# Patient Record
Sex: Female | Born: 1965 | Race: Black or African American | Hispanic: No | Marital: Married | Smoking: Never smoker
Health system: Southern US, Community
[De-identification: ages and names within clinical notes are randomized; demographics above are authoritative.]

## PROBLEM LIST (undated history)

## (undated) DIAGNOSIS — D219 Benign neoplasm of connective and other soft tissue, unspecified: Secondary | ICD-10-CM

## (undated) DIAGNOSIS — I10 Essential (primary) hypertension: Secondary | ICD-10-CM

---

## 1998-08-12 HISTORY — PX: TUBAL LIGATION: SHX77

## 2000-08-12 HISTORY — PX: CHOLECYSTECTOMY: SHX55

## 2007-02-04 ENCOUNTER — Encounter: Admission: RE | Admit: 2007-02-04 | Discharge: 2007-02-04 | Payer: Self-pay | Admitting: Family Medicine

## 2009-04-26 ENCOUNTER — Ambulatory Visit (HOSPITAL_COMMUNITY): Admission: RE | Admit: 2009-04-26 | Discharge: 2009-04-26 | Payer: Self-pay | Admitting: Family Medicine

## 2010-04-27 ENCOUNTER — Ambulatory Visit (HOSPITAL_COMMUNITY): Admission: RE | Admit: 2010-04-27 | Discharge: 2010-04-27 | Payer: Self-pay | Admitting: Internal Medicine

## 2010-05-01 ENCOUNTER — Encounter: Admission: RE | Admit: 2010-05-01 | Discharge: 2010-05-01 | Payer: Self-pay | Admitting: Family Medicine

## 2011-04-02 ENCOUNTER — Other Ambulatory Visit (HOSPITAL_COMMUNITY): Payer: Self-pay | Admitting: Internal Medicine

## 2011-04-02 DIAGNOSIS — Z139 Encounter for screening, unspecified: Secondary | ICD-10-CM

## 2011-04-09 ENCOUNTER — Other Ambulatory Visit: Payer: Self-pay | Admitting: Family Medicine

## 2011-04-09 DIAGNOSIS — Z1231 Encounter for screening mammogram for malignant neoplasm of breast: Secondary | ICD-10-CM

## 2011-05-03 ENCOUNTER — Ambulatory Visit (HOSPITAL_COMMUNITY): Payer: Self-pay

## 2011-05-07 ENCOUNTER — Ambulatory Visit
Admission: RE | Admit: 2011-05-07 | Discharge: 2011-05-07 | Disposition: A | Discharge status: Home / Self Care | Payer: 59 | Source: Ambulatory Visit | Attending: Family Medicine | Admitting: Family Medicine

## 2011-05-07 DIAGNOSIS — Z1231 Encounter for screening mammogram for malignant neoplasm of breast: Secondary | ICD-10-CM

## 2012-05-21 ENCOUNTER — Other Ambulatory Visit (HOSPITAL_COMMUNITY): Payer: Self-pay | Admitting: Family Medicine

## 2012-05-21 DIAGNOSIS — D4860 Neoplasm of uncertain behavior of unspecified breast: Secondary | ICD-10-CM

## 2012-05-27 ENCOUNTER — Ambulatory Visit (HOSPITAL_COMMUNITY)
Admission: RE | Admit: 2012-05-27 | Discharge: 2012-05-27 | Disposition: A | Discharge status: Home / Self Care | Payer: 59 | Source: Ambulatory Visit | Attending: Family Medicine | Admitting: Family Medicine

## 2012-05-27 DIAGNOSIS — N6009 Solitary cyst of unspecified breast: Secondary | ICD-10-CM | POA: Insufficient documentation

## 2012-05-27 DIAGNOSIS — D4860 Neoplasm of uncertain behavior of unspecified breast: Secondary | ICD-10-CM

## 2012-07-24 ENCOUNTER — Other Ambulatory Visit: Payer: Self-pay | Admitting: Obstetrics and Gynecology

## 2012-07-24 DIAGNOSIS — D259 Leiomyoma of uterus, unspecified: Secondary | ICD-10-CM

## 2012-08-20 ENCOUNTER — Other Ambulatory Visit: Payer: Self-pay | Admitting: Emergency Medicine

## 2012-08-20 ENCOUNTER — Ambulatory Visit
Admission: RE | Admit: 2012-08-20 | Discharge: 2012-08-20 | Disposition: A | Discharge status: Home / Self Care | Payer: 59 | Source: Ambulatory Visit | Attending: Obstetrics and Gynecology | Admitting: Obstetrics and Gynecology

## 2012-08-20 DIAGNOSIS — D259 Leiomyoma of uterus, unspecified: Secondary | ICD-10-CM

## 2012-08-20 DIAGNOSIS — I10 Essential (primary) hypertension: Secondary | ICD-10-CM

## 2012-08-20 LAB — BUN: BUN: 12 mg/dL (ref 6–23)

## 2012-08-20 LAB — CREATININE WITH EST GFR: Creat: 0.64 mg/dL (ref 0.50–1.10)

## 2012-08-20 NOTE — Progress Notes (Signed)
LMP:  07/26/2012       Menstrual cycles:  Frequency q 20 days.  Length x 7 days.  Menorrhagia on Day 2 of cycles w/ small clots.   During "heavy" days, change pads & tampons q 2  Hrs.    Denies spotting between cycles.  Bulk Sx:  Bloating; abd & pelvic pressure; back discomfort; urinary retention.  Pt states that she feels as if she needs to bear down to empty bladder.  GYN measures uterus at 18-20 wk size.  Pt states that she has been anemic & has been instructed to take OTC Iron.  She admits that she has NOT yet started taking Iron as directed.

## 2012-08-27 ENCOUNTER — Ambulatory Visit
Admission: RE | Admit: 2012-08-27 | Discharge: 2012-08-27 | Disposition: A | Discharge status: Home / Self Care | Payer: 59 | Source: Ambulatory Visit | Attending: Obstetrics and Gynecology | Admitting: Obstetrics and Gynecology

## 2012-08-27 DIAGNOSIS — D259 Leiomyoma of uterus, unspecified: Secondary | ICD-10-CM

## 2012-08-27 MED ORDER — GADOBENATE DIMEGLUMINE 529 MG/ML IV SOLN
16.0000 mL | Freq: Once | INTRAVENOUS | Status: AC | PRN
  Administered 2012-08-27: 16 mL via INTRAVENOUS

## 2012-08-28 ENCOUNTER — Telehealth: Payer: Self-pay | Admitting: Emergency Medicine

## 2012-08-28 NOTE — Telephone Encounter (Signed)
Lm to make pt. Aware that Dr Miles Costain reviewed the MRI and she is good to go for the UFE.  Will file w/ ins. And have Margaret Oliver w/[facility] to contact her directly.- call me back if any questions.

## 2012-09-09 ENCOUNTER — Telehealth: Payer: Self-pay | Admitting: Emergency Medicine

## 2012-09-09 NOTE — Telephone Encounter (Signed)
S/W PT TO MAKE HER AWARE THAT THE [place] PROCEDURE CODES ARE APPROVED AND TINAL AT [facility] WILL CONTACT PT TO SET UP APPT.

## 2012-09-22 ENCOUNTER — Telehealth: Payer: Self-pay | Admitting: Emergency Medicine

## 2012-09-22 NOTE — Telephone Encounter (Signed)
LM FOR PT TO HAVE HER CALL ME ABOUT HER INSURANCE BENEFITS FOR THE [place] PROCEDURE..... AS STANDS NOW, :  PT HAS A 200.00 - DED., MAX OUT OF POCKET $2000.00  AFTER THAT HER INS. PAYS AT 100%, PT IS RESPONSIBLE TO PAY 10% UNTIL HER MAX OUT OF POCKET.   Pt never responded.

## 2012-09-29 ENCOUNTER — Other Ambulatory Visit: Payer: Self-pay | Admitting: Interventional Radiology

## 2012-09-29 DIAGNOSIS — N939 Abnormal uterine and vaginal bleeding, unspecified: Secondary | ICD-10-CM

## 2013-01-07 ENCOUNTER — Telehealth (HOSPITAL_COMMUNITY): Payer: Self-pay | Admitting: Radiology

## 2013-01-07 NOTE — Telephone Encounter (Signed)
Called patient to see if she wants to have UFE scheduled.  Last contact with patient was 09-02-12, and patient stated that she would call IR when she was ready to have procedure scheduled.  Patient stated that she would like to have procedure done in July 2014.  We will call her back once we have the Physician schedules for July.

## 2013-02-18 ENCOUNTER — Encounter (HOSPITAL_COMMUNITY): Payer: Self-pay | Admitting: Pharmacy Technician

## 2013-02-22 ENCOUNTER — Other Ambulatory Visit: Payer: Self-pay | Admitting: Radiology

## 2013-02-23 ENCOUNTER — Other Ambulatory Visit: Payer: Self-pay | Admitting: Radiology

## 2013-02-24 ENCOUNTER — Other Ambulatory Visit: Payer: Self-pay | Admitting: Radiology

## 2013-02-26 ENCOUNTER — Ambulatory Visit (HOSPITAL_COMMUNITY)
Admission: RE | Admit: 2013-02-26 | Discharge: 2013-02-26 | Disposition: A | Discharge status: Home / Self Care | Payer: 59 | Source: Ambulatory Visit | Attending: Interventional Radiology | Admitting: Interventional Radiology

## 2013-02-26 ENCOUNTER — Observation Stay (HOSPITAL_COMMUNITY)
Admission: RE | Admit: 2013-02-26 | Discharge: 2013-02-27 | Disposition: A | Discharge status: Home / Self Care | Payer: 59 | Source: Ambulatory Visit | Attending: Interventional Radiology | Admitting: Interventional Radiology

## 2013-02-26 ENCOUNTER — Other Ambulatory Visit: Payer: Self-pay | Admitting: Interventional Radiology

## 2013-02-26 ENCOUNTER — Encounter (HOSPITAL_COMMUNITY): Payer: Self-pay

## 2013-02-26 VITALS — BP 148/96 | HR 96 | Temp 98.6°F | Resp 20 | Ht 64.0 in | Wt 175.0 lb

## 2013-02-26 DIAGNOSIS — E785 Hyperlipidemia, unspecified: Secondary | ICD-10-CM | POA: Insufficient documentation

## 2013-02-26 DIAGNOSIS — I10 Essential (primary) hypertension: Secondary | ICD-10-CM | POA: Insufficient documentation

## 2013-02-26 DIAGNOSIS — N939 Abnormal uterine and vaginal bleeding, unspecified: Secondary | ICD-10-CM

## 2013-02-26 DIAGNOSIS — E669 Obesity, unspecified: Secondary | ICD-10-CM | POA: Insufficient documentation

## 2013-02-26 DIAGNOSIS — R112 Nausea with vomiting, unspecified: Secondary | ICD-10-CM | POA: Insufficient documentation

## 2013-02-26 DIAGNOSIS — D259 Leiomyoma of uterus, unspecified: Principal | ICD-10-CM | POA: Insufficient documentation

## 2013-02-26 HISTORY — PX: UTERINE ARTERY EMBOLIZATION: SHX2629

## 2013-02-26 HISTORY — DX: Essential (primary) hypertension: I10

## 2013-02-26 LAB — BASIC METABOLIC PANEL
BUN: 10 mg/dL (ref 6–23)
Calcium: 9.5 mg/dL (ref 8.4–10.5)
GFR calc non Af Amer: >90 mL/min (ref >90)
Glucose, Bld: 95 mg/dL (ref 70–99)
Potassium: 3.2 mEq/L — ABNORMAL LOW (ref 3.5–5.1)

## 2013-02-26 LAB — HCG, SERUM, QUALITATIVE: Preg, Serum: NEGATIVE

## 2013-02-26 LAB — CBC
Hemoglobin: 10 g/dL — ABNORMAL LOW (ref 12.0–15.0)
MCH: 23 pg — ABNORMAL LOW (ref 26.0–34.0)
MCHC: 29.7 g/dL — ABNORMAL LOW (ref 30.0–36.0)

## 2013-02-26 MED ORDER — SODIUM CHLORIDE 0.9 % IJ SOLN: 3.0000 mL | Freq: Two times a day (BID) | INTRAMUSCULAR | Status: DC

## 2013-02-26 MED ORDER — KETOROLAC TROMETHAMINE 30 MG/ML IJ SOLN
30.0000 mg | Freq: Once | INTRAMUSCULAR | Status: AC
  Administered 2013-02-26: 30 mg via INTRAVENOUS

## 2013-02-26 MED ORDER — SODIUM CHLORIDE 0.9 % IJ SOLN: 3.0000 mL | INTRAMUSCULAR | Status: DC | PRN

## 2013-02-26 MED ORDER — MIDAZOLAM HCL 2 MG/2ML IJ SOLN
INTRAMUSCULAR | Status: AC | PRN
  Administered 2013-02-26 (×2): 0.5 mg via INTRAVENOUS
  Administered 2013-02-26 (×2): 1 mg via INTRAVENOUS
  Administered 2013-02-26: 0.5 mg via INTRAVENOUS

## 2013-02-26 MED ORDER — SODIUM CHLORIDE 0.9 % IV SOLN
Freq: Once | INTRAVENOUS | Status: AC
  Administered 2013-02-26: 08:00:00 via INTRAVENOUS

## 2013-02-26 MED ORDER — PROMETHAZINE HCL 25 MG PO TABS: 25.0000 mg | ORAL_TABLET | Freq: Three times a day (TID) | ORAL | Status: DC | PRN

## 2013-02-26 MED ORDER — HYDROMORPHONE HCL PF 2 MG/ML IJ SOLN
INTRAMUSCULAR | Status: AC
  Filled 2013-02-26: qty 1

## 2013-02-26 MED ORDER — SODIUM CHLORIDE 0.9 % IJ SOLN: 9.0000 mL | INTRAMUSCULAR | Status: DC | PRN

## 2013-02-26 MED ORDER — LIDOCAINE HCL 1 % IJ SOLN
INTRAMUSCULAR | Status: AC
  Filled 2013-02-26: qty 20

## 2013-02-26 MED ORDER — CEFAZOLIN SODIUM-DEXTROSE 2-3 GM-% IV SOLR
2.0000 g | Freq: Once | INTRAVENOUS | Status: AC
  Administered 2013-02-26: 2 g via INTRAVENOUS

## 2013-02-26 MED ORDER — PROMETHAZINE HCL 25 MG RE SUPP: 25.0000 mg | Freq: Three times a day (TID) | RECTAL | Status: DC | PRN

## 2013-02-26 MED ORDER — SODIUM CHLORIDE 0.9 % IV SOLN: 250.0000 mL | INTRAVENOUS | Status: DC | PRN

## 2013-02-26 MED ORDER — FENTANYL CITRATE 0.05 MG/ML IJ SOLN
INTRAMUSCULAR | Status: AC
  Filled 2013-02-26: qty 6

## 2013-02-26 MED ORDER — FENTANYL CITRATE 0.05 MG/ML IJ SOLN
INTRAMUSCULAR | Status: DC | PRN
  Administered 2013-02-26 (×2): 25 ug via INTRAVENOUS
  Administered 2013-02-26: 100 ug via INTRAVENOUS
  Administered 2013-02-26: 25 ug via INTRAVENOUS

## 2013-02-26 MED ORDER — KETOROLAC TROMETHAMINE 30 MG/ML IJ SOLN
INTRAMUSCULAR | Status: AC
  Filled 2013-02-26: qty 1

## 2013-02-26 MED ORDER — IBUPROFEN 800 MG PO TABS
800.0000 mg | ORAL_TABLET | Freq: Four times a day (QID) | ORAL | Status: DC
  Administered 2013-02-26 – 2013-02-27 (×3): 800 mg via ORAL
  Filled 2013-02-26 (×6): qty 1

## 2013-02-26 MED ORDER — ONDANSETRON HCL 4 MG/2ML IJ SOLN
4.0000 mg | Freq: Four times a day (QID) | INTRAMUSCULAR | Status: DC | PRN
  Administered 2013-02-26: 4 mg via INTRAVENOUS
  Filled 2013-02-26: qty 2

## 2013-02-26 MED ORDER — DOCUSATE SODIUM 100 MG PO CAPS
100.0000 mg | ORAL_CAPSULE | Freq: Two times a day (BID) | ORAL | Status: DC
  Administered 2013-02-26 – 2013-02-27 (×2): 100 mg via ORAL
  Filled 2013-02-26 (×3): qty 1

## 2013-02-26 MED ORDER — HYDROMORPHONE 0.3 MG/ML IV SOLN
INTRAVENOUS | Status: DC
  Administered 2013-02-26: 0.3 mg via INTRAVENOUS
  Administered 2013-02-26: 11:00:00 via INTRAVENOUS
  Administered 2013-02-26 – 2013-02-27 (×2): 0.3 mg via INTRAVENOUS

## 2013-02-26 MED ORDER — DIPHENHYDRAMINE HCL 50 MG/ML IJ SOLN: 12.5000 mg | Freq: Four times a day (QID) | INTRAMUSCULAR | Status: DC | PRN

## 2013-02-26 MED ORDER — HYDROMORPHONE HCL PF 1 MG/ML IJ SOLN
INTRAMUSCULAR | Status: AC | PRN
  Administered 2013-02-26 (×2): 0.5 mg via INTRAVENOUS

## 2013-02-26 MED ORDER — NALOXONE HCL 0.4 MG/ML IJ SOLN: 0.4000 mg | INTRAMUSCULAR | Status: DC | PRN

## 2013-02-26 MED ORDER — CEFAZOLIN SODIUM-DEXTROSE 2-3 GM-% IV SOLR
INTRAVENOUS | Status: AC
  Filled 2013-02-26: qty 50

## 2013-02-26 MED ORDER — DIPHENHYDRAMINE HCL 12.5 MG/5ML PO ELIX
12.5000 mg | ORAL_SOLUTION | Freq: Four times a day (QID) | ORAL | Status: DC | PRN
  Filled 2013-02-26: qty 5

## 2013-02-26 MED ORDER — MIDAZOLAM HCL 2 MG/2ML IJ SOLN
INTRAMUSCULAR | Status: AC
  Filled 2013-02-26: qty 6

## 2013-02-26 NOTE — Progress Notes (Signed)
Subjective: Pt c/o mild pelvic cramping; occ nausea; has had 1 episode of emesis  Objective: Vital signs in last 24 hours: Temp:  [98.1 F (36.7 C)-98.8 F (37.1 C)] 98.2 F (36.8 C) (07/18 1529) Pulse Rate:  [57-102] 91 (07/18 1529) Resp:  [9-25] 16 (07/18 1529) BP: (109-148)/(76-100) 146/93 mmHg (07/18 1529) SpO2:  [95 %-100 %] 100 % (07/18 1529) Weight:  [175 lb (79.379 kg)] 175 lb (79.379 kg) (07/18 0735)    Intake/Output from previous day:   Intake/Output this shift:    Pt awake/alert; abd- soft, mildly tender pelvic region; R/L femoral artery puncture sites clean, dry, NT, no hematomas; intact distal pulses  Lab Results:   Recent Labs  02/26/13 0745  WBC 6.6  HGB 10.0*  HCT 33.7*  PLT 336   BMET  Recent Labs  02/26/13 0745  NA 133*  K 3.2*  CL 97  CO2 27  GLUCOSE 95  BUN 10  CREATININE 0.68  CALCIUM 9.5   PT/INR  Recent Labs  02/26/13 0745  LABPROT 12.1  INR 0.91   ABG No results found for this basename: PHART, PCO2, PO2, HCO3,  in the last 72 hours  Studies/Results: Ir Angiogram Pelvis Selective Or Supraselective  02/26/2013   *RADIOLOGY REPORT*  Clinical Data: Abnormal menstrual bleeding, uterine fibroids  UTERINE FIBROID EMBOLIZATION  Date:  02/26/2013 09:30:00  Radiologist:  [facility]  Medications:  3 grams ancefadminstered within one hour of the procedure,3.5 mg Versed, 175 mcg Fentanyl, 30 mg Toradol, 1 mg of body  Guidance:  Ultrasound fluoroscopic  Fluoroscopy time:  16.5 minutes  Sedation time:  1 hour 35 minutes  Contrast volume:  130 ml Omnipaque-300  Complications:  No immediate  PROCEDURE/FINDINGS:  Informed consent was obtained from the patient following explanation of the procedure, risks, benefits and alternatives. The patient understands, agrees and consents for the procedure. All questions were addressed.  A time out was performed.  Maximal barrier sterile technique utilized including caps, mask, sterile gowns, sterile gloves, large  sterile drape, hand hygiene, and betadine prep.  Under sterile conditions and local anesthesia, bilateralcommon femoral artery access was performed with a micropuncture needle. Ultrasound was utilized for access.  Images were obtained for documentation.  A guide wire was advanced followed by a 5-French sheaths.  A 5-French C2 catheter was utilized to select the contralateral left internal iliac artery.  Selective left internal iliac angiogram was performed.  The tortuous left uterine artery was identified.  Selective catheterization was performed of the left uterine artery with a microcatheter and micro guide wire.  A selective left uterine angiogram was performed.  This demonstrated patency of the left uterine artery.  Mild diffuse hypervascularity of the enlarged fibroid uterus.  Access was adequate for embolization.  For embolization, two vials of 500 - Embospheres and one vialof 700-900 micron Embospheres were injected into the left uterine artery.  Post embolization angiogram confirms complete stasis of the left uterine vascular territory.  A second C2 catheter was utilized to select the right internal iliac artery.  Selective right internal iliac angiogram was performed.  The patent right uterine artery was identified.  For selective catheterization, a second micro catheter and guidewire were utilized to select the right uterine artery.  Selective right uterine angiogram was performed.  This demonstrated patency of the right uterine artery. This is the dominant supply to the larger right uterine fibroid.  Catheter position was safe for embolization.  Embolization was performed to complete stasis with injection of two  vials of 500-700 micron Embospheres and five vialsof 700-900 micron Embospheres. Post embolization angiogram confirms complete stasis of the right uterine vascular territory.  Bilateral C2 and micro catheters were removed.  Injection of bothcommon femoral artery sheaths confirms access  is adequate for the Exoseal on the right side. Successful hemostasis obtained on the right side with an Exoseal.   Left C F A hemostasis was obtained with 15 minutes manual compression.  The patient tolerated the procedure well.  No immediate complication.  IMPRESSION: Successful bilateral uterine artery embolization (U F E)   Original Report Authenticated By: Judie Petit. Miles Costain, M.D.   Ir Angiogram Pelvis Selective Or Supraselective  02/26/2013   *RADIOLOGY REPORT*  Clinical Data: Abnormal menstrual bleeding, uterine fibroids  UTERINE FIBROID EMBOLIZATION  Date:  02/26/2013 09:30:00  Radiologist:  [facility]  Medications:  3 grams ancefadminstered within one hour of the procedure,3.5 mg Versed, 175 mcg Fentanyl, 30 mg Toradol, 1 mg of body  Guidance:  Ultrasound fluoroscopic  Fluoroscopy time:  16.5 minutes  Sedation time:  1 hour 35 minutes  Contrast volume:  130 ml Omnipaque-300  Complications:  No immediate  PROCEDURE/FINDINGS:  Informed consent was obtained from the patient following explanation of the procedure, risks, benefits and alternatives. The patient understands, agrees and consents for the procedure. All questions were addressed.  A time out was performed.  Maximal barrier sterile technique utilized including caps, mask, sterile gowns, sterile gloves, large sterile drape, hand hygiene, and betadine prep.  Under sterile conditions and local anesthesia, bilateralcommon femoral artery access was performed with a micropuncture needle. Ultrasound was utilized for access.  Images were obtained for documentation.  A guide wire was advanced followed by a 5-French sheaths.  A 5-French C2 catheter was utilized to select the contralateral left internal iliac artery.  Selective left internal iliac angiogram was performed.  The tortuous left uterine artery was identified.  Selective catheterization was performed of the left uterine artery with a microcatheter and micro guide wire.  A selective left uterine angiogram was  performed.  This demonstrated patency of the left uterine artery.  Mild diffuse hypervascularity of the enlarged fibroid uterus.  Access was adequate for embolization.  For embolization, two vials of 500 - Embospheres and one vialof 700-900 micron Embospheres were injected into the left uterine artery.  Post embolization angiogram confirms complete stasis of the left uterine vascular territory.  A second C2 catheter was utilized to select the right internal iliac artery.  Selective right internal iliac angiogram was performed.  The patent right uterine artery was identified.  For selective catheterization, a second micro catheter and guidewire were utilized to select the right uterine artery.  Selective right uterine angiogram was performed.  This demonstrated patency of the right uterine artery. This is the dominant supply to the larger right uterine fibroid.  Catheter position was safe for embolization.  Embolization was performed to complete stasis with injection of two vials of 500-700 micron Embospheres and five vialsof 700-900 micron Embospheres. Post embolization angiogram confirms complete stasis of the right uterine vascular territory.  Bilateral C2 and micro catheters were removed.  Injection of bothcommon femoral artery sheaths confirms access is adequate for the Exoseal on the right side. Successful hemostasis obtained on the right side with an Exoseal.   Left C F A hemostasis was obtained with 15 minutes manual compression.  The patient tolerated the procedure well.  No immediate complication.  IMPRESSION: Successful bilateral uterine artery embolization (U F E)   Original Report  Authenticated By: Judie Petit. Miles Costain, M.D.   Ir Angiogram Selective Each Additional Vessel  02/26/2013   *RADIOLOGY REPORT*  Clinical Data: Abnormal menstrual bleeding, uterine fibroids  UTERINE FIBROID EMBOLIZATION  Date:  02/26/2013 09:30:00  Radiologist:  [facility]  Medications:  3 grams ancefadminstered within one hour of the  procedure,3.5 mg Versed, 175 mcg Fentanyl, 30 mg Toradol, 1 mg of body  Guidance:  Ultrasound fluoroscopic  Fluoroscopy time:  16.5 minutes  Sedation time:  1 hour 35 minutes  Contrast volume:  130 ml Omnipaque-300  Complications:  No immediate  PROCEDURE/FINDINGS:  Informed consent was obtained from the patient following explanation of the procedure, risks, benefits and alternatives. The patient understands, agrees and consents for the procedure. All questions were addressed.  A time out was performed.  Maximal barrier sterile technique utilized including caps, mask, sterile gowns, sterile gloves, large sterile drape, hand hygiene, and betadine prep.  Under sterile conditions and local anesthesia, bilateralcommon femoral artery access was performed with a micropuncture needle. Ultrasound was utilized for access.  Images were obtained for documentation.  A guide wire was advanced followed by a 5-French sheaths.  A 5-French C2 catheter was utilized to select the contralateral left internal iliac artery.  Selective left internal iliac angiogram was performed.  The tortuous left uterine artery was identified.  Selective catheterization was performed of the left uterine artery with a microcatheter and micro guide wire.  A selective left uterine angiogram was performed.  This demonstrated patency of the left uterine artery.  Mild diffuse hypervascularity of the enlarged fibroid uterus.  Access was adequate for embolization.  For embolization, two vials of 500 - Embospheres and one vialof 700-900 micron Embospheres were injected into the left uterine artery.  Post embolization angiogram confirms complete stasis of the left uterine vascular territory.  A second C2 catheter was utilized to select the right internal iliac artery.  Selective right internal iliac angiogram was performed.  The patent right uterine artery was identified.  For selective catheterization, a second micro catheter and guidewire were  utilized to select the right uterine artery.  Selective right uterine angiogram was performed.  This demonstrated patency of the right uterine artery. This is the dominant supply to the larger right uterine fibroid.  Catheter position was safe for embolization.  Embolization was performed to complete stasis with injection of two vials of 500-700 micron Embospheres and five vialsof 700-900 micron Embospheres. Post embolization angiogram confirms complete stasis of the right uterine vascular territory.  Bilateral C2 and micro catheters were removed.  Injection of bothcommon femoral artery sheaths confirms access is adequate for the Exoseal on the right side. Successful hemostasis obtained on the right side with an Exoseal.   Left C F A hemostasis was obtained with 15 minutes manual compression.  The patient tolerated the procedure well.  No immediate complication.  IMPRESSION: Successful bilateral uterine artery embolization (U F E)   Original Report Authenticated By: Judie Petit. Miles Costain, M.D.   Ir Angiogram Selective Each Additional Vessel  02/26/2013   *RADIOLOGY REPORT*  Clinical Data: Abnormal menstrual bleeding, uterine fibroids  UTERINE FIBROID EMBOLIZATION  Date:  02/26/2013 09:30:00  Radiologist:  [facility]  Medications:  3 grams ancefadminstered within one hour of the procedure,3.5 mg Versed, 175 mcg Fentanyl, 30 mg Toradol, 1 mg of body  Guidance:  Ultrasound fluoroscopic  Fluoroscopy time:  16.5 minutes  Sedation time:  1 hour 35 minutes  Contrast volume:  130 ml Omnipaque-300  Complications:  No immediate  PROCEDURE/FINDINGS:  Informed consent was obtained from the patient following explanation of the procedure, risks, benefits and alternatives. The patient understands, agrees and consents for the procedure. All questions were addressed.  A time out was performed.  Maximal barrier sterile technique utilized including caps, mask, sterile gowns, sterile gloves, large sterile drape, hand hygiene, and betadine prep.  Under  sterile conditions and local anesthesia, bilateralcommon femoral artery access was performed with a micropuncture needle. Ultrasound was utilized for access.  Images were obtained for documentation.  A guide wire was advanced followed by a 5-French sheaths.  A 5-French C2 catheter was utilized to select the contralateral left internal iliac artery.  Selective left internal iliac angiogram was performed.  The tortuous left uterine artery was identified.  Selective catheterization was performed of the left uterine artery with a microcatheter and micro guide wire.  A selective left uterine angiogram was performed.  This demonstrated patency of the left uterine artery.  Mild diffuse hypervascularity of the enlarged fibroid uterus.  Access was adequate for embolization.  For embolization, two vials of 500 - Embospheres and one vialof 700-900 micron Embospheres were injected into the left uterine artery.  Post embolization angiogram confirms complete stasis of the left uterine vascular territory.  A second C2 catheter was utilized to select the right internal iliac artery.  Selective right internal iliac angiogram was performed.  The patent right uterine artery was identified.  For selective catheterization, a second micro catheter and guidewire were utilized to select the right uterine artery.  Selective right uterine angiogram was performed.  This demonstrated patency of the right uterine artery. This is the dominant supply to the larger right uterine fibroid.  Catheter position was safe for embolization.  Embolization was performed to complete stasis with injection of two vials of 500-700 micron Embospheres and five vialsof 700-900 micron Embospheres. Post embolization angiogram confirms complete stasis of the right uterine vascular territory.  Bilateral C2 and micro catheters were removed.  Injection of bothcommon femoral artery sheaths confirms access is adequate for the Exoseal on the right side.  Successful hemostasis obtained on the right side with an Exoseal.   Left C F A hemostasis was obtained with 15 minutes manual compression.  The patient tolerated the procedure well.  No immediate complication.  IMPRESSION: Successful bilateral uterine artery embolization (U F E)   Original Report Authenticated By: Judie Petit. Miles Costain, M.D.   Ir US Guide Vasc Access Left  02/26/2013   *RADIOLOGY REPORT*  Clinical Data: Abnormal menstrual bleeding, uterine fibroids  UTERINE FIBROID EMBOLIZATION  Date:  02/26/2013 09:30:00  Radiologist:  [facility]  Medications:  3 grams ancefadminstered within one hour of the procedure,3.5 mg Versed, 175 mcg Fentanyl, 30 mg Toradol, 1 mg of body  Guidance:  Ultrasound fluoroscopic  Fluoroscopy time:  16.5 minutes  Sedation time:  1 hour 35 minutes  Contrast volume:  130 ml Omnipaque-300  Complications:  No immediate  PROCEDURE/FINDINGS:  Informed consent was obtained from the patient following explanation of the procedure, risks, benefits and alternatives. The patient understands, agrees and consents for the procedure. All questions were addressed.  A time out was performed.  Maximal barrier sterile technique utilized including caps, mask, sterile gowns, sterile gloves, large sterile drape, hand hygiene, and betadine prep.  Under sterile conditions and local anesthesia, bilateralcommon femoral artery access was performed with a micropuncture needle. Ultrasound was utilized for access.  Images were obtained for documentation.  A guide wire was advanced followed by a 5-French sheaths.  A 5-French  C2 catheter was utilized to select the contralateral left internal iliac artery.  Selective left internal iliac angiogram was performed.  The tortuous left uterine artery was identified.  Selective catheterization was performed of the left uterine artery with a microcatheter and micro guide wire.  A selective left uterine angiogram was performed.  This demonstrated patency of the left uterine artery.  Mild  diffuse hypervascularity of the enlarged fibroid uterus.  Access was adequate for embolization.  For embolization, two vials of 500 - Embospheres and one vialof 700-900 micron Embospheres were injected into the left uterine artery.  Post embolization angiogram confirms complete stasis of the left uterine vascular territory.  A second C2 catheter was utilized to select the right internal iliac artery.  Selective right internal iliac angiogram was performed.  The patent right uterine artery was identified.  For selective catheterization, a second micro catheter and guidewire were utilized to select the right uterine artery.  Selective right uterine angiogram was performed.  This demonstrated patency of the right uterine artery. This is the dominant supply to the larger right uterine fibroid.  Catheter position was safe for embolization.  Embolization was performed to complete stasis with injection of two vials of 500-700 micron Embospheres and five vialsof 700-900 micron Embospheres. Post embolization angiogram confirms complete stasis of the right uterine vascular territory.  Bilateral C2 and micro catheters were removed.  Injection of bothcommon femoral artery sheaths confirms access is adequate for the Exoseal on the right side. Successful hemostasis obtained on the right side with an Exoseal.   Left C F A hemostasis was obtained with 15 minutes manual compression.  The patient tolerated the procedure well.  No immediate complication.  IMPRESSION: Successful bilateral uterine artery embolization (U F E)   Original Report Authenticated By: Judie Petit. Miles Costain, M.D.   Ir US Guide Vasc Access Right  02/26/2013   *RADIOLOGY REPORT*  Clinical Data: Abnormal menstrual bleeding, uterine fibroids  UTERINE FIBROID EMBOLIZATION  Date:  02/26/2013 09:30:00  Radiologist:  [facility]  Medications:  3 grams ancefadminstered within one hour of the procedure,3.5 mg Versed, 175 mcg Fentanyl, 30 mg Toradol, 1 mg of body  Guidance:   Ultrasound fluoroscopic  Fluoroscopy time:  16.5 minutes  Sedation time:  1 hour 35 minutes  Contrast volume:  130 ml Omnipaque-300  Complications:  No immediate  PROCEDURE/FINDINGS:  Informed consent was obtained from the patient following explanation of the procedure, risks, benefits and alternatives. The patient understands, agrees and consents for the procedure. All questions were addressed.  A time out was performed.  Maximal barrier sterile technique utilized including caps, mask, sterile gowns, sterile gloves, large sterile drape, hand hygiene, and betadine prep.  Under sterile conditions and local anesthesia, bilateralcommon femoral artery access was performed with a micropuncture needle. Ultrasound was utilized for access.  Images were obtained for documentation.  A guide wire was advanced followed by a 5-French sheaths.  A 5-French C2 catheter was utilized to select the contralateral left internal iliac artery.  Selective left internal iliac angiogram was performed.  The tortuous left uterine artery was identified.  Selective catheterization was performed of the left uterine artery with a microcatheter and micro guide wire.  A selective left uterine angiogram was performed.  This demonstrated patency of the left uterine artery.  Mild diffuse hypervascularity of the enlarged fibroid uterus.  Access was adequate for embolization.  For embolization, two vials of 500 - Embospheres and one vialof 700-900 micron Embospheres were injected into the left  uterine artery.  Post embolization angiogram confirms complete stasis of the left uterine vascular territory.  A second C2 catheter was utilized to select the right internal iliac artery.  Selective right internal iliac angiogram was performed.  The patent right uterine artery was identified.  For selective catheterization, a second micro catheter and guidewire were utilized to select the right uterine artery.  Selective right uterine angiogram was  performed.  This demonstrated patency of the right uterine artery. This is the dominant supply to the larger right uterine fibroid.  Catheter position was safe for embolization.  Embolization was performed to complete stasis with injection of two vials of 500-700 micron Embospheres and five vialsof 700-900 micron Embospheres. Post embolization angiogram confirms complete stasis of the right uterine vascular territory.  Bilateral C2 and micro catheters were removed.  Injection of bothcommon femoral artery sheaths confirms access is adequate for the Exoseal on the right side. Successful hemostasis obtained on the right side with an Exoseal.   Left C F A hemostasis was obtained with 15 minutes manual compression.  The patient tolerated the procedure well.  No immediate complication.  IMPRESSION: Successful bilateral uterine artery embolization (U F E)   Original Report Authenticated By: Judie Petit. Miles Costain, M.D.   Ir Embo Tumor Organ Ischemia Infarct Inc Guide Roadmapping  02/26/2013   *RADIOLOGY REPORT*  Clinical Data: Abnormal menstrual bleeding, uterine fibroids  UTERINE FIBROID EMBOLIZATION  Date:  02/26/2013 09:30:00  Radiologist:  [facility]  Medications:  3 grams ancefadminstered within one hour of the procedure,3.5 mg Versed, 175 mcg Fentanyl, 30 mg Toradol, 1 mg of body  Guidance:  Ultrasound fluoroscopic  Fluoroscopy time:  16.5 minutes  Sedation time:  1 hour 35 minutes  Contrast volume:  130 ml Omnipaque-300  Complications:  No immediate  PROCEDURE/FINDINGS:  Informed consent was obtained from the patient following explanation of the procedure, risks, benefits and alternatives. The patient understands, agrees and consents for the procedure. All questions were addressed.  A time out was performed.  Maximal barrier sterile technique utilized including caps, mask, sterile gowns, sterile gloves, large sterile drape, hand hygiene, and betadine prep.  Under sterile conditions and local anesthesia, bilateralcommon femoral  artery access was performed with a micropuncture needle. Ultrasound was utilized for access.  Images were obtained for documentation.  A guide wire was advanced followed by a 5-French sheaths.  A 5-French C2 catheter was utilized to select the contralateral left internal iliac artery.  Selective left internal iliac angiogram was performed.  The tortuous left uterine artery was identified.  Selective catheterization was performed of the left uterine artery with a microcatheter and micro guide wire.  A selective left uterine angiogram was performed.  This demonstrated patency of the left uterine artery.  Mild diffuse hypervascularity of the enlarged fibroid uterus.  Access was adequate for embolization.  For embolization, two vials of 500 - Embospheres and one vialof 700-900 micron Embospheres were injected into the left uterine artery.  Post embolization angiogram confirms complete stasis of the left uterine vascular territory.  A second C2 catheter was utilized to select the right internal iliac artery.  Selective right internal iliac angiogram was performed.  The patent right uterine artery was identified.  For selective catheterization, a second micro catheter and guidewire were utilized to select the right uterine artery.  Selective right uterine angiogram was performed.  This demonstrated patency of the right uterine artery. This is the dominant supply to the larger right uterine fibroid.  Catheter position was safe  for embolization.  Embolization was performed to complete stasis with injection of two vials of 500-700 micron Embospheres and five vialsof 700-900 micron Embospheres. Post embolization angiogram confirms complete stasis of the right uterine vascular territory.  Bilateral C2 and micro catheters were removed.  Injection of bothcommon femoral artery sheaths confirms access is adequate for the Exoseal on the right side. Successful hemostasis obtained on the right side with an Exoseal.   Left C  F A hemostasis was obtained with 15 minutes manual compression.  The patient tolerated the procedure well.  No immediate complication.  IMPRESSION: Successful bilateral uterine artery embolization (U F E)   Original Report Authenticated By: Judie Petit. Miles Costain, M.D.    Anti-infectives: Anti-infectives   Start     Dose/Rate Route Frequency Ordered Stop   02/26/13 0848  ceFAZolin (ANCEF) 2-3 GM-% IVPB SOLR    Comments:  BRISSON, MARK: cabinet override      02/26/13 0848 02/26/13 2059      Assessment/Plan: s/p bilateral uterine artery embolization 7/18 for symptomatic uterine fibroids; cont PCA dilaudid, antiemetics; d/c foley at 1700; for overnight obs; f/u IR clinic visit with Dr. Miles Costain in 2-4 weeks  LOS: 0 days    Chijioke Lasser,D [facility] 02/26/2013

## 2013-02-26 NOTE — Progress Notes (Signed)
Received pt from Radiology post uterine tumor embolization. Alert and oriented, VSS. Will continue with current plan of care.

## 2013-02-26 NOTE — H&P (Signed)
Margaret Oliver is an 47 y.o. female.   Chief Complaint: abnormal menstrual bleeding, uterine fibroids HPI: Patient with symptomatic uterine fibroids presents today for bilateral uterine artery embolization.  Past Medical History  Diagnosis Date  . Hypertension     Past Surgical History  Procedure Laterality Date  . Cholecystectomy  2002  . Tubal ligation  2000  . Cesarean section  1992    History reviewed. No pertinent family history. Social History:  reports that she has never smoked. She has never used smokeless tobacco. She reports that she does not drink alcohol or use illicit drugs.  Allergies: No Known Allergies   Current outpatient prescriptions:lisinopril-hydrochlorothiazide (PRINZIDE,ZESTORETIC) 10-12.5 MG per tablet, Take 1 tablet by mouth at bedtime., Disp: , Rfl: ;  simvastatin (ZOCOR) 40 MG tablet, Take 40 mg by mouth every evening., Disp: , Rfl:  No current facility-administered medications for this encounter. Facility-Administered Medications Ordered in Other Encounters: ceFAZolin (ANCEF) IVPB 2 g/50 mL premix, 2 g, Intravenous, Once, Koreen D Morgan, PA-C;  ketorolac (TORADOL) 30 MG/ML injection 30 mg, 30 mg, Intravenous, Once, Berneta Levins, PA-C  Review of Systems  Constitutional: Negative for fever and chills.  Respiratory: Negative for cough and shortness of breath.   Cardiovascular: Negative for chest pain.  Gastrointestinal: Negative for nausea, vomiting and blood in stool.       Occ pelvic pain  Genitourinary: Positive for frequency. Negative for dysuria and hematuria.  Musculoskeletal: Positive for back pain.  Neurological: Negative for headaches.  Endo/Heme/Allergies:       Menorrhagia   Results for orders placed in visit on 08/20/12  CREATININE WITH EST GFR      Result Value Range   Creat 0.64  0.50 - 1.10 mg/dL   GFR, Est African American >89     GFR, Est Non African American >89    BUN      Result Value Range   BUN 12  6 - 23 mg/dL   1/61/09  labs pending  Blood pressure 130/86, pulse 102, temperature 98.8 F (37.1 C), temperature source Oral, height 5\' 4"  (1.626 m), weight 175 lb (79.379 kg), last menstrual period 01/31/2013, SpO2 99.00%. Physical Exam  Constitutional: She is oriented to person, place, and time. She appears well-developed and well-nourished.  Cardiovascular: Intact distal pulses.   Sl tachy but reg rhythm  Respiratory: Effort normal and breath sounds normal.  GI: Soft. Bowel sounds are normal.  Mod obese, fibroid uterus  Musculoskeletal: Normal range of motion. She exhibits no edema.  Neurological: She is alert and oriented to person, place, and time.     Assessment/Plan Pt with symptomatic uterine fibroids. Plan is for bilateral uterine artery embolization today followed by overnight observation for hemodynamic monitoring and pain control.  Details/risks of procedure d/w pt/husband with their understanding and consent.  Ireoluwa Grant,D [facility] 02/26/2013, 8:19 AM

## 2013-02-26 NOTE — ED Notes (Signed)
5Fr sheath removed from RFA by Dr. Miles Costain.  Hemostasis achieved using Exoseal device.  R groin level 0, 3+RDP, gauze dressing CDI. 5Fr sheath removed from LFA by Merlene Morse, RT.  Hemostasis achieved used V Pad.  L groin level 0, 3+LDP, gauze dressing CDI.

## 2013-02-26 NOTE — Procedures (Signed)
Successful bilateral [place] TO COMPLETE STASIS NO COMP STABLE FULL REPORT IN PACS OVERNIGHT RECOVERY

## 2013-02-27 LAB — BASIC METABOLIC PANEL
CO2: 25 mEq/L (ref 19–32)
Calcium: 9.1 mg/dL (ref 8.4–10.5)
Chloride: 96 mEq/L (ref 96–112)
Glucose, Bld: 117 mg/dL — ABNORMAL HIGH (ref 70–99)
Potassium: 3.2 mEq/L — ABNORMAL LOW (ref 3.5–5.1)
Sodium: 135 mEq/L (ref 135–145)

## 2013-02-27 LAB — CBC
Hemoglobin: 9.7 g/dL — ABNORMAL LOW (ref 12.0–15.0)
MCH: 23.3 pg — ABNORMAL LOW (ref 26.0–34.0)
RBC: 4.16 MIL/uL (ref 3.87–5.11)

## 2013-02-27 NOTE — Progress Notes (Signed)
02/27/13 1125 Reviewed discharge instructions with patient. Patient verbalized understanding of discharge. Copy of discharge papers and prescriptions given to patient. Left groin dressing changed and band aid applied, no drainage at site.

## 2013-02-27 NOTE — Discharge Summary (Signed)
Physician Discharge Summary  Patient ID: Margaret Oliver MRN: [id number] DOB/AGE: [birth year] 47 y.o.  Admit date: 02/26/2013 Discharge date: 02/27/2013  Admission Diagnoses: Menorrhagia; symptomatic uterine fibroids  Discharge Diagnoses: Uterine artery embolization  Active Problems: HTN; HLD  Discharged Condition: improved; stable  Hospital Course: Bilateral Uterine Artery Embolization performed by Dr Miles Costain in IR 02/26/13. Pt tolerated the procedure well. No complications; Overnight stay was uneventful except slight nausea last pm and 1 episode of emesis- none since. No N/V now. Eating well. Slept well. Urinating and passing gas.  Has no complaint of pain or cramps. I have seen and examined pt. Noted slight rise in WBC- probable secondary to embolization- with absence of fever or pain. Discussed with Dr Archer Asa.  Rx to pt: Vicodin 5/325 #30 Ibuprofen 600 mg #30 Colace 100 mg #10 Phenergan 12.5 #10   Consults: None  Significant Diagnostic Studies: Pelvic arteriogram  Treatments: Bilateral Uterine Artery Embolization  Discharge Exam: Blood pressure 148/96, pulse 96, temperature 98.6 F (37 C), temperature source Oral, resp. rate 20, height 5\' 4"  (1.626 m), weight 175 lb (79.379 kg), last menstrual period 01/31/2013, SpO2 100.00%.  PE: A/O No complaints [place]: RRR Lungs: CTA Abd: soft; +BS; NT Extr: 2+pulses B B groin: NT; no bleeding; no hematoma Ambulating well UOP good: yellow  Results for orders placed during the hospital encounter of 02/26/13  CBC      Result Value Range   WBC 12.4 (*) 4.0 - 10.5 K/uL   RBC 4.16  3.87 - 5.11 MIL/uL   Hemoglobin 9.7 (*) 12.0 - 15.0 g/dL   HCT 40.9 (*) 81.1 - 91.4 %   MCV 77.6 (*) 78.0 - 100.0 fL   MCH 23.3 (*) 26.0 - 34.0 pg   MCHC 30.0  30.0 - 36.0 g/dL   RDW 78.2 (*) 95.6 - 21.3 %   Platelets 309  150 - 400 K/uL  BASIC METABOLIC PANEL      Result Value Range   Sodium 135  135 - 145 mEq/L   Potassium 3.2 (*) 3.5 - 5.1  mEq/L   Chloride 96  96 - 112 mEq/L   CO2 25  19 - 32 mEq/L   Glucose, Bld 117 (*) 70 - 99 mg/dL   BUN 7  6 - 23 mg/dL   Creatinine, Ser 0.86  0.50 - 1.10 mg/dL   Calcium 9.1  8.4 - 57.8 mg/dL   GFR calc non Af Amer >90  >90 mL/min   GFR calc Af Amer >90  >90 mL/min    Disposition: B Uterine Artery embolization 02/26/13 Pt has done well overnight- no complication. I have seen and examined pt; discussed with Dr Archer Asa Plan dc now. Follow up with Dr Miles Costain in clinic- we will call pt with appt date and time. Rx: Vicodin 5/325 #30; Ibuprofen 600mg  #30        Colace 100 mg #10; Phenergan #10 Call [phone] or [phone] if questions Pt has good understanding of all Rxs and dc instructions  Discharge Orders   Future Orders Complete By Expires     Call MD for:  persistant nausea and vomiting  As directed     Call MD for:  redness, tenderness, or signs of infection (pain, swelling, redness, odor or green/yellow discharge around incision site)  As directed     Call MD for:  severe uncontrolled pain  As directed     Call MD for:  temperature >100.4  As directed     Diet - low  sodium heart healthy  As directed     Discharge instructions  As directed     Comments:      Pt to follow up with Dr Miles Costain in clinic- [phone]; clinic will call with appt date and time; call [phone] if problems or questions    Discharge wound care:  As directed     Comments:      May shower today- leave bandage on - replace with clean bandaid after shower; replace daily x 3-5 days    Driving Restrictions  As directed     Comments:      No driving x 3 days    IR Radiologist Eval & Mgmt  As directed 04/30/2014    Questions:      Is the patient pregnant?:  No    Preferred Imaging Location?:  [facility]    Reason for Exam (SYMPTOM  OR DIAGNOSIS REQUIRED):  follow up with Dr Miles Costain post UFE 7/18    Increase activity slowly  As directed     Lifting restrictions  As directed     Comments:      No  lifting over 10 lbs x 1 week; do not lift wheel chair x 2 weeks        Medication List         lisinopril-hydrochlorothiazide 10-12.5 MG per tablet  Commonly known as:  PRINZIDE,ZESTORETIC  Take 1 tablet by mouth at bedtime.     simvastatin 40 MG tablet  Commonly known as:  ZOCOR  Take 40 mg by mouth every evening.         Signed: Josip Merolla A 02/27/2013, 10:44 AM

## 2013-02-27 NOTE — Discharge Summary (Signed)
Agree with PA discharge summary.  Signed,  Sterling Big, MD [facility]

## 2013-03-24 ENCOUNTER — Ambulatory Visit
Admission: RE | Admit: 2013-03-24 | Discharge: 2013-03-24 | Disposition: A | Discharge status: Home / Self Care | Payer: 59 | Source: Ambulatory Visit | Attending: Radiology | Admitting: Radiology

## 2013-03-24 VITALS — BP 128/86 | HR 98 | Temp 99.0°F | Resp 16 | Ht 64.0 in | Wt 168.0 lb

## 2013-03-24 DIAGNOSIS — N939 Abnormal uterine and vaginal bleeding, unspecified: Secondary | ICD-10-CM

## 2013-03-24 HISTORY — DX: Benign neoplasm of connective and other soft tissue, unspecified: D21.9

## 2013-03-24 NOTE — Progress Notes (Signed)
LMP:  01/31/2013 which was prior to [place].  Afebrile.  Spotting post [place] x 5-7 days.  Denies cramping.  Bulk Sx relieved (abdominal pain, urinary retention, urinary frequency).    Has resumed all normal activities.  Avaree Gilberti Rincon, RN 03/24/2013 1:20 PM

## 2013-05-25 ENCOUNTER — Telehealth: Payer: Self-pay | Admitting: Radiology

## 2013-05-25 NOTE — Telephone Encounter (Signed)
3 mo followup [place] phone call.    LMP:  05/22/2013.  Patient states that her menstrual cycles are normal.  Denies heavy flow or pain.  Does not feel that she needs office visit at present.  Will schedule 6 mo followup for early 2015.  Zhi Geier Carmell Austria, RN 05/25/2013 9:23 AM

## 2013-08-03 ENCOUNTER — Other Ambulatory Visit (HOSPITAL_COMMUNITY): Payer: Self-pay | Admitting: Interventional Radiology

## 2013-08-03 DIAGNOSIS — D259 Leiomyoma of uterus, unspecified: Secondary | ICD-10-CM

## 2013-08-26 ENCOUNTER — Other Ambulatory Visit: Payer: Self-pay | Admitting: Obstetrics and Gynecology

## 2013-08-26 DIAGNOSIS — D259 Leiomyoma of uterus, unspecified: Secondary | ICD-10-CM

## 2013-10-05 ENCOUNTER — Other Ambulatory Visit: Payer: 59

## 2015-01-24 IMAGING — XA IR EMBO TUMOR ORGAN ISCHEMIA INFARCT INC GUIDE ROADMAPPING
12 of 15 series · 13 of 24 positions shown · IV contrast (IODINE)
Comparison: none

CLINICAL DATA: Abnormal menstrual bleeding, uterine fibroids

[Series 1: care single · 1 of 1 slices shown]
[im 1/1]
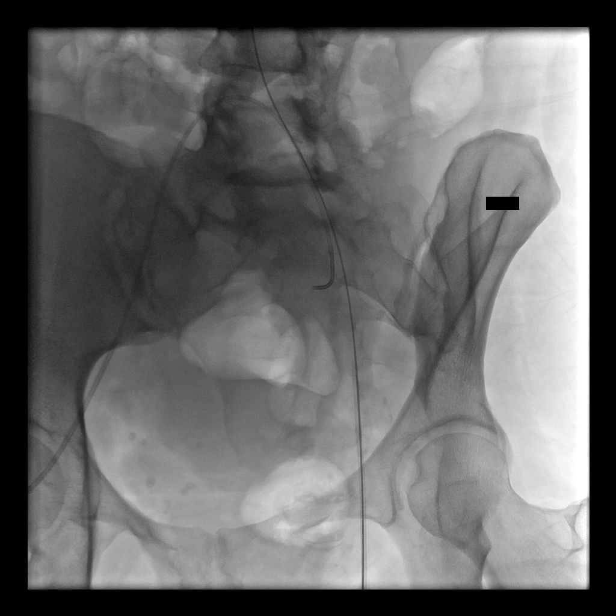

[Series 3: fl - angio · 1 of 4 frames shown]
[frame 1/4]
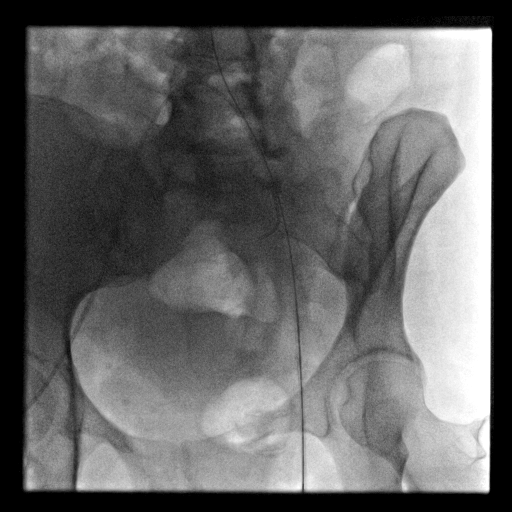

[Series 4: body 4 · 1 of 23 frames shown (1 of 9)]
[frame 20/23]
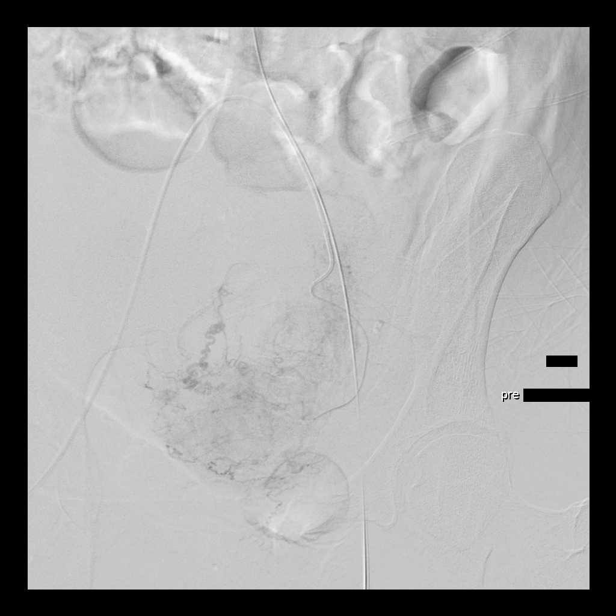

[Series 5: body 4 · 1 of 18 frames shown (2 of 9)]
[frame 10/18]
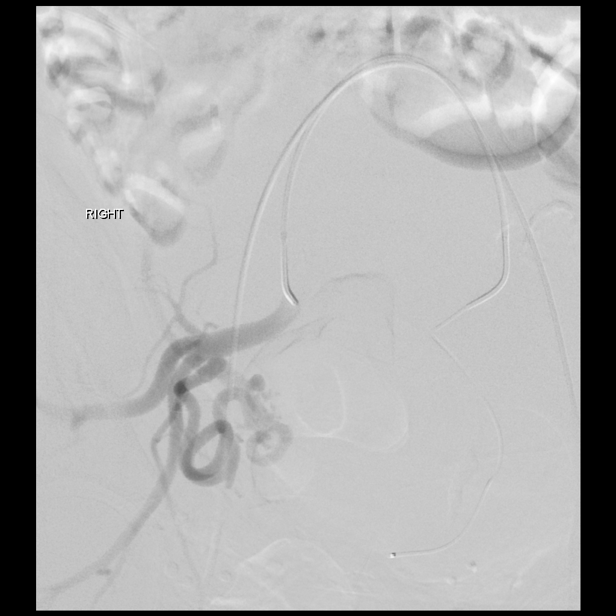

[Series 6: body 4 · 1 of 34 frames shown (3 of 9)]
[frame 34/34]
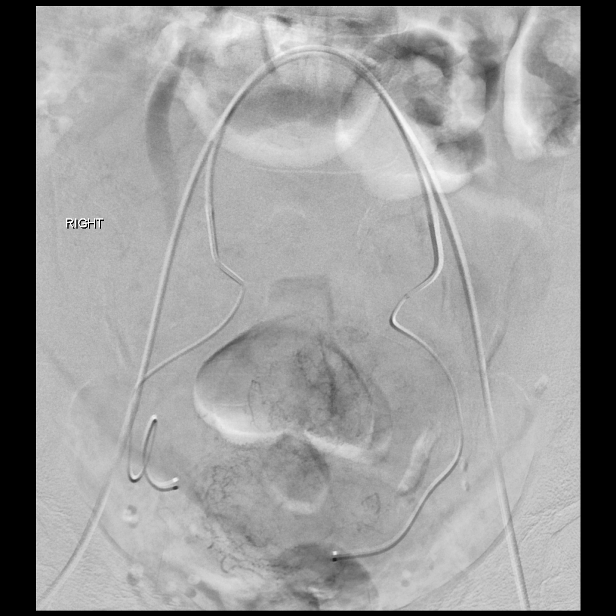

[Series 8: body 4 · 1 of 61 frames shown (4 of 9)]
[frame 10/61]
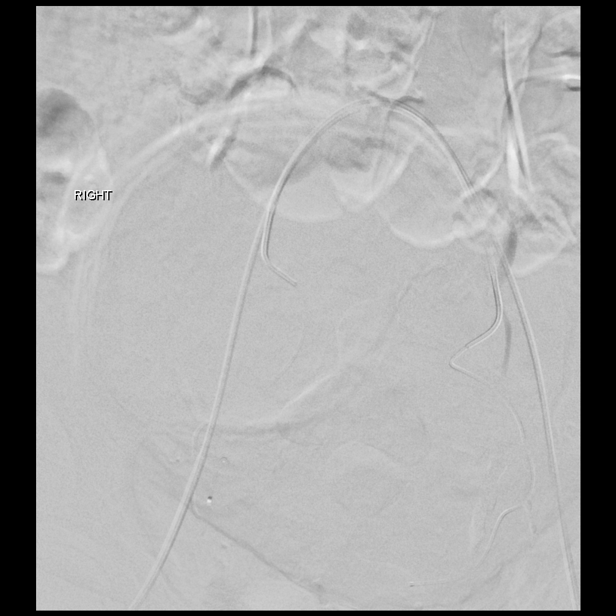

[Series 9: body 4 · 2 of 29 frames shown (5 of 9)]
[frame 15/29]
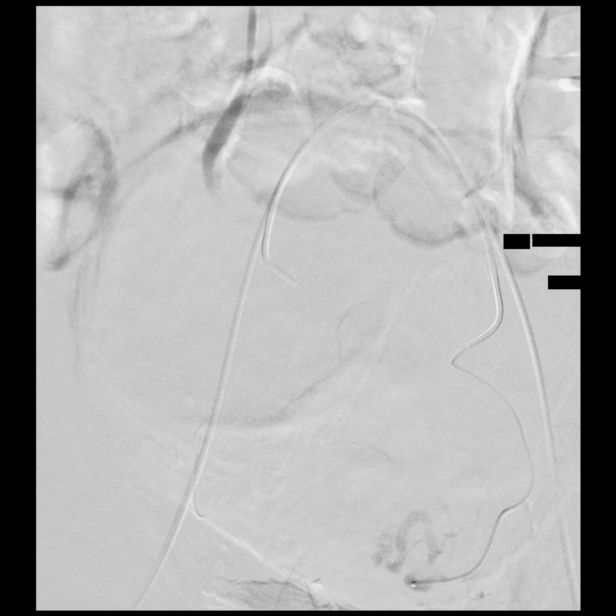
[frame 25/29]
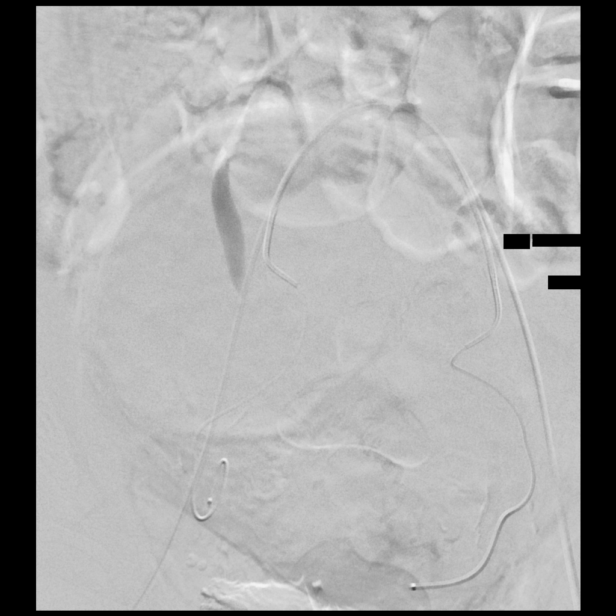

[Series 11: body 4 · 1 of 19 frames shown (6 of 9)]
[frame 1/19]
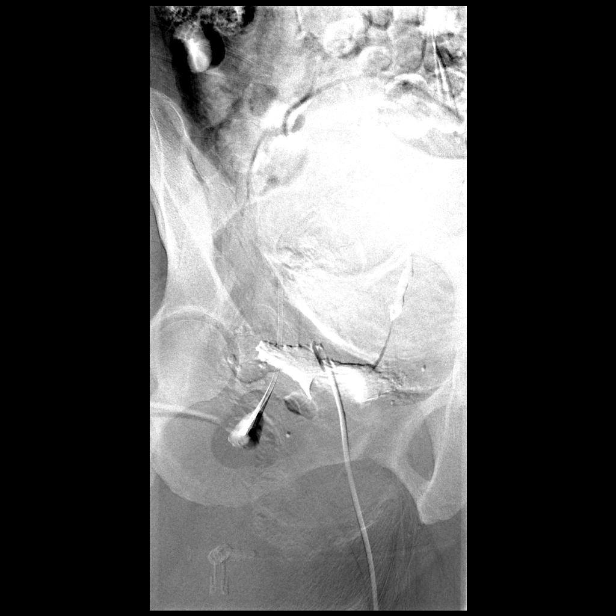

[Series 12: body 4 · 1 of 19 frames shown (7 of 9)]
[frame 3/19]
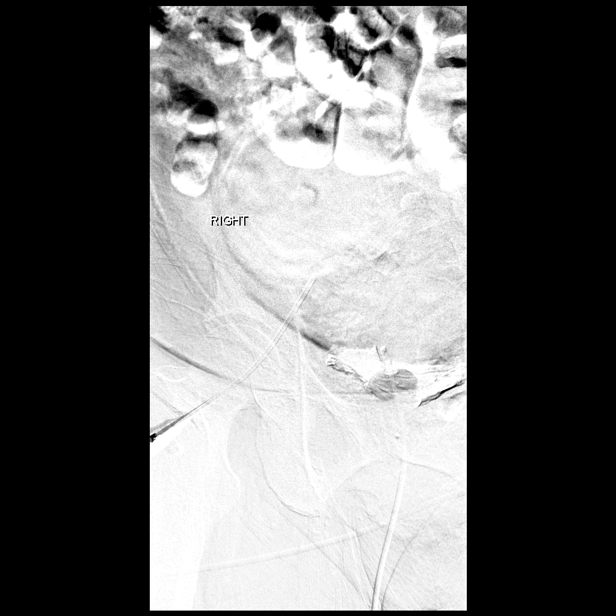

[Series 13: body 4 · 1 of 18 frames shown (8 of 9)]
[frame 10/18]
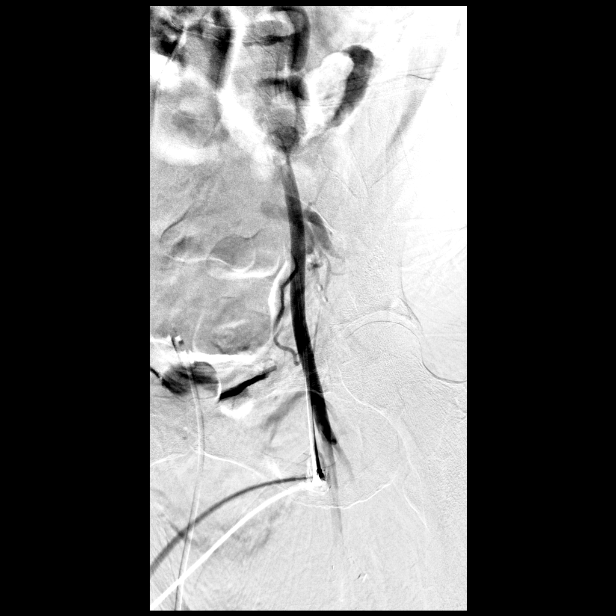

[Series 14: body 4 · 1 of 23 frames shown (9 of 9)]
[frame 20/23]
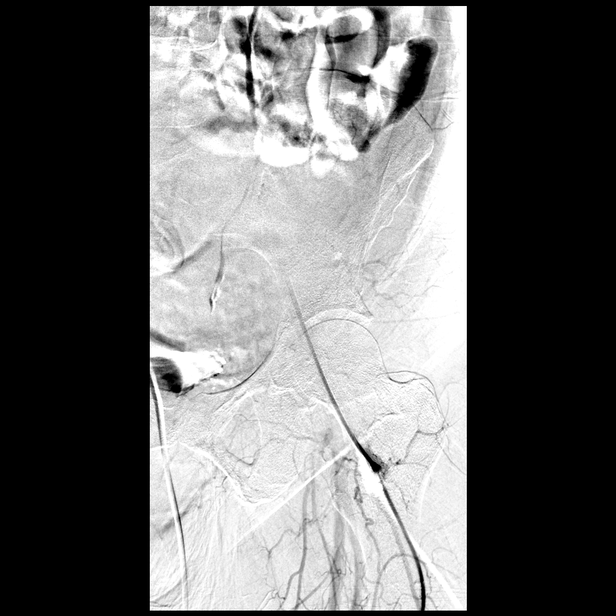

[Series 100: ir us guide vasc access*r* · 1 of 5 slices shown]
[im 5/5]
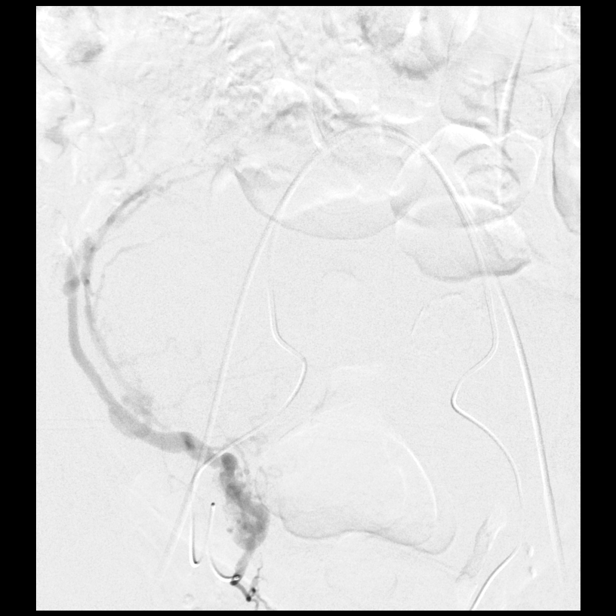

[13 of 24 positions shown; findings below may reference images not displayed]

UTERINE FIBROID EMBOLIZATION

Date:  02/26/2013 [DATE]

Radiologist:  SILVIA CECILIA

Medications:  3 grams ancefadminstered within one hour of the
procedure,3.5 mg Versed, 175 mcg Fentanyl, 30 mg Toradol, 1 mg of
body

Guidance:  Ultrasound fluoroscopic

Fluoroscopy time:  16.5 minutes

Sedation time:  1 hour 35 minutes

Contrast volume:  130 ml [id number]-[id number]

Complications:  No immediate

PROCEDURE/FINDINGS:

Informed consent was obtained from the patient following
explanation of the procedure, risks, benefits and alternatives.
The patient understands, agrees and consents for the procedure.
All questions were addressed.  A time out was performed.

Maximal barrier sterile technique utilized including caps, mask,
sterile gowns, sterile gloves, large sterile drape, hand hygiene,
and betadine prep.

Under sterile conditions and local anesthesia, bilateralcommon
femoral artery access was performed with a micropuncture needle.
Ultrasound was utilized for access.  Images were obtained for
documentation.  A guide wire was advanced followed by a 5-French
sheaths.  A 5-French C2 catheter was utilized to select the
contralateral left internal iliac artery.  Selective left internal
iliac angiogram was performed.  The tortuous left uterine artery
was identified.  Selective catheterization was performed of the
left uterine artery with a microcatheter and micro guide wire.  A
selective left uterine angiogram was performed.  This demonstrated
patency of the left uterine artery.  Mild diffuse hypervascularity
of the enlarged fibroid uterus.  Access was adequate for
embolization.  For embolization, two vials of 500 - 166micron
Embospheres and one vialof 700-900 micron Embospheres were injected
into the left uterine artery.  Post embolization angiogram confirms
complete stasis of the left uterine vascular territory.

A second C2 catheter was utilized to select the right internal
iliac artery.  Selective right internal iliac angiogram was
performed.  The patent right uterine artery was identified.  For
selective catheterization, a second micro catheter and guidewire
were utilized to select the right uterine artery.  Selective right
uterine angiogram was performed.  This demonstrated patency of the
right uterine artery. This is the dominant supply to the larger
right uterine fibroid.  Catheter position was safe for
embolization.  Embolization was performed to complete stasis with
injection of two vials of 500-700 micron Embospheres and five
vialsof 700-900 micron Embospheres. Post embolization angiogram
confirms complete stasis of the right uterine vascular territory.

Bilateral C2 and micro catheters were removed.

Injection of bothcommon femoral artery sheaths confirms access is
adequate for the Exoseal on the right side. Successful hemostasis
obtained on the right side with an Exoseal.   Left C F A hemostasis
was obtained with 15 minutes manual compression.

The patient tolerated the procedure well.  No immediate
complication.
IMPRESSION: Successful bilateral uterine artery embolization (U F E)

## 2015-02-21 ENCOUNTER — Other Ambulatory Visit (HOSPITAL_COMMUNITY): Payer: Self-pay | Admitting: Physician Assistant

## 2015-02-21 DIAGNOSIS — Z1231 Encounter for screening mammogram for malignant neoplasm of breast: Secondary | ICD-10-CM

## 2015-02-27 ENCOUNTER — Ambulatory Visit (HOSPITAL_COMMUNITY)
Admission: RE | Admit: 2015-02-27 | Discharge: 2015-02-27 | Disposition: A | Discharge status: Home / Self Care | Payer: 59 | Source: Ambulatory Visit | Attending: Physician Assistant | Admitting: Physician Assistant

## 2015-02-27 DIAGNOSIS — Z1231 Encounter for screening mammogram for malignant neoplasm of breast: Secondary | ICD-10-CM | POA: Diagnosis present

## 2016-10-07 DIAGNOSIS — E039 Hypothyroidism, unspecified: Secondary | ICD-10-CM | POA: Diagnosis not present

## 2016-10-07 DIAGNOSIS — E782 Mixed hyperlipidemia: Secondary | ICD-10-CM | POA: Diagnosis not present

## 2016-10-07 DIAGNOSIS — I10 Essential (primary) hypertension: Secondary | ICD-10-CM | POA: Diagnosis not present

## 2016-10-07 DIAGNOSIS — Z1389 Encounter for screening for other disorder: Secondary | ICD-10-CM | POA: Diagnosis not present

## 2016-11-22 DIAGNOSIS — I10 Essential (primary) hypertension: Secondary | ICD-10-CM | POA: Diagnosis not present

## 2016-11-22 DIAGNOSIS — Z1389 Encounter for screening for other disorder: Secondary | ICD-10-CM | POA: Diagnosis not present

## 2017-03-31 ENCOUNTER — Other Ambulatory Visit (HOSPITAL_COMMUNITY): Payer: Self-pay | Admitting: Family Medicine

## 2017-03-31 DIAGNOSIS — Z1231 Encounter for screening mammogram for malignant neoplasm of breast: Secondary | ICD-10-CM

## 2017-04-03 ENCOUNTER — Ambulatory Visit (HOSPITAL_COMMUNITY)
Admission: RE | Admit: 2017-04-03 | Discharge: 2017-04-03 | Disposition: A | Discharge status: Home / Self Care | Payer: BLUE CROSS/BLUE SHIELD | Source: Ambulatory Visit | Attending: Family Medicine | Admitting: Family Medicine

## 2017-04-03 ENCOUNTER — Encounter (HOSPITAL_COMMUNITY): Payer: Self-pay | Admitting: Radiology

## 2017-04-03 DIAGNOSIS — Z1231 Encounter for screening mammogram for malignant neoplasm of breast: Secondary | ICD-10-CM

## 2019-10-04 ENCOUNTER — Other Ambulatory Visit: Payer: Self-pay

## 2019-10-04 ENCOUNTER — Ambulatory Visit: Payer: 59 | Attending: Internal Medicine

## 2019-10-04 DIAGNOSIS — Z20822 Contact with and (suspected) exposure to covid-19: Secondary | ICD-10-CM | POA: Insufficient documentation

## 2019-10-05 LAB — NOVEL CORONAVIRUS, NAA: SARS-CoV-2, NAA: NOT DETECTED

## 2021-06-16 ENCOUNTER — Encounter: Payer: Self-pay | Admitting: Emergency Medicine

## 2021-06-16 ENCOUNTER — Ambulatory Visit
Admission: EM | Admit: 2021-06-16 | Discharge: 2021-06-16 | Disposition: A | Discharge status: Home / Self Care | Payer: 59 | Attending: Family Medicine | Admitting: Family Medicine

## 2021-06-16 ENCOUNTER — Other Ambulatory Visit: Payer: Self-pay

## 2021-06-16 DIAGNOSIS — R062 Wheezing: Secondary | ICD-10-CM

## 2021-06-16 DIAGNOSIS — J069 Acute upper respiratory infection, unspecified: Secondary | ICD-10-CM

## 2021-06-16 MED ORDER — PREDNISONE 20 MG PO TABS
40.0000 mg | ORAL_TABLET | Freq: Every day | ORAL | 0 refills | Status: AC
Start: 2021-06-16 — End: ?

## 2021-06-16 MED ORDER — PROMETHAZINE-DM 6.25-15 MG/5ML PO SYRP
5.0000 mL | ORAL_SOLUTION | Freq: Four times a day (QID) | ORAL | 0 refills | Status: AC | PRN
Start: 2021-06-16 — End: ?

## 2021-06-16 NOTE — ED Triage Notes (Signed)
Has been using mucinex-DM, has been coughing excessively. Using Claritin as well.  Nasal/head/chest  congestion and sneezing.

## 2021-06-18 NOTE — ED Provider Notes (Signed)
  [place]   [id number] 06/16/21 Arrival Time: 2595  ASSESSMENT & PLAN:  1. Viral URI with cough   2. Wheezing    Discussed typical duration of viral illnesses. No resp distress. OTC symptom care as needed.  Meds ordered this encounter  Medications   predniSONE (DELTASONE) 20 MG tablet    Sig: Take 2 tablets (40 mg total) by mouth daily.    Dispense:  10 tablet    Refill:  0   promethazine-dextromethorphan (PROMETHAZINE-DM) 6.25-15 MG/5ML syrup    Sig: Take 5 mLs by mouth 4 (four) times daily as needed for cough.    Dispense:  118 mL    Refill:  0     Follow-up Information     Jake Samples, PA-C.   Specialty: Family Medicine Why: If worsening or failing to improve as anticipated. Contact information: [address] [place] [address] [phone]                 Reviewed expectations re: course of current medical issues. Questions answered. Outlined signs and symptoms indicating need for more acute intervention. Understanding verbalized. After Visit Summary given.   SUBJECTIVE: History from: patient. Margaret Oliver is a 55 y.o. female who reports: coughing, sneezing, nasal congestion, body aches, fatigue; abrupt onset sev days ago. Denies: fever and difficulty breathing. Does ques wheezing at times. Normal PO intake without n/v/d.   OBJECTIVE:  Vitals:   06/16/21 1520 06/16/21 1521  BP: 111/75   Pulse: (!) 112   Resp: 20   Temp: 99.3 F (37.4 C)   TempSrc: Oral   SpO2: 97%   Weight:  81.6 kg    Slight tachycardia noted. General appearance: alert; no distress Eyes: PERRLA; EOMI; conjunctiva normal HENT: [place]; AT; with nasal congestion Neck: supple  Lungs: speaks full sentences without difficulty; unlabored; bilat mild exp wheezing Extremities: no edema Skin: warm and dry Neurologic: normal gait Psychological: alert and cooperative; normal mood and affect   No Known Allergies  Past Medical History:  Diagnosis Date    Fibroids    Hypertension    Social History   Socioeconomic History   Marital status: Married    Spouse name: Not on file   Number of children: Not on file   Years of education: Not on file   Highest education level: Not on file  Occupational History   Not on file  Tobacco Use   Smoking status: Never   Smokeless tobacco: Never  Substance and Sexual Activity   Alcohol use: No   Drug use: No   Sexual activity: Yes    Birth control/protection: Surgical  Other Topics Concern   Not on file  Social History Narrative   Not on file   Social Determinants of Health   Financial Resource Strain: Not on file  Food Insecurity: Not on file  Transportation Needs: Not on file  Physical Activity: Not on file  Stress: Not on file  Social Connections: Not on file  Intimate Partner Violence: Not on file   No family history on file. Past Surgical History:  Procedure Laterality Date   CESAREAN SECTION  1992   CHOLECYSTECTOMY  2002   TUBAL LIGATION  2000   UTERINE ARTERY EMBOLIZATION  02/26/2013     Vanessa Kick, MD 06/18/21 [phone]

## 2022-04-25 ENCOUNTER — Other Ambulatory Visit: Payer: Self-pay | Admitting: Family Medicine

## 2022-04-25 DIAGNOSIS — Z1231 Encounter for screening mammogram for malignant neoplasm of breast: Secondary | ICD-10-CM

## 2022-05-23 ENCOUNTER — Ambulatory Visit: Payer: 59

## 2022-07-02 ENCOUNTER — Ambulatory Visit
Admission: RE | Admit: 2022-07-02 | Discharge: 2022-07-02 | Disposition: A | Discharge status: Home / Self Care | Payer: 59 | Source: Ambulatory Visit | Attending: Family Medicine | Admitting: Family Medicine

## 2022-07-02 DIAGNOSIS — Z1231 Encounter for screening mammogram for malignant neoplasm of breast: Secondary | ICD-10-CM

## 2024-07-22 ENCOUNTER — Ambulatory Visit: Payer: Self-pay | Admitting: Family Medicine

## 2024-07-22 ENCOUNTER — Ambulatory Visit: Admitting: Family Medicine

## 2024-07-22 ENCOUNTER — Encounter (INDEPENDENT_AMBULATORY_CARE_PROVIDER_SITE_OTHER): Payer: Self-pay

## 2024-07-22 VITALS — BP 134/82 | HR 93 | Temp 98.9°F | Ht 64.0 in | Wt 181.0 lb

## 2024-07-22 DIAGNOSIS — Z23 Encounter for immunization: Secondary | ICD-10-CM

## 2024-07-22 DIAGNOSIS — R499 Unspecified voice and resonance disorder: Secondary | ICD-10-CM

## 2024-07-22 DIAGNOSIS — E66811 Obesity, class 1: Secondary | ICD-10-CM

## 2024-07-22 DIAGNOSIS — Z7689 Persons encountering health services in other specified circumstances: Secondary | ICD-10-CM

## 2024-07-22 DIAGNOSIS — Z6831 Body mass index (BMI) 31.0-31.9, adult: Secondary | ICD-10-CM

## 2024-07-22 DIAGNOSIS — Z1211 Encounter for screening for malignant neoplasm of colon: Secondary | ICD-10-CM

## 2024-07-22 DIAGNOSIS — R070 Pain in throat: Secondary | ICD-10-CM

## 2024-07-22 DIAGNOSIS — I10 Essential (primary) hypertension: Secondary | ICD-10-CM | POA: Insufficient documentation

## 2024-07-22 DIAGNOSIS — E6609 Other obesity due to excess calories: Secondary | ICD-10-CM

## 2024-07-22 DIAGNOSIS — R7989 Other specified abnormal findings of blood chemistry: Secondary | ICD-10-CM

## 2024-07-22 DIAGNOSIS — Z1231 Encounter for screening mammogram for malignant neoplasm of breast: Secondary | ICD-10-CM

## 2024-07-22 DIAGNOSIS — E785 Hyperlipidemia, unspecified: Secondary | ICD-10-CM | POA: Insufficient documentation

## 2024-07-22 DIAGNOSIS — E782 Mixed hyperlipidemia: Secondary | ICD-10-CM

## 2024-07-22 LAB — LIPID PANEL
Cholesterol: 263 mg/dL — ABNORMAL HIGH (ref 0–200)
HDL: 49.4 mg/dL (ref >39.00)
LDL Cholesterol: 176 mg/dL — ABNORMAL HIGH (ref 0–99)
NonHDL: 213.38
Total CHOL/HDL Ratio: 5
Triglycerides: 186 mg/dL — ABNORMAL HIGH (ref 0.0–149.0)
VLDL: 37.2 mg/dL (ref 0.0–40.0)

## 2024-07-22 LAB — COMPREHENSIVE METABOLIC PANEL WITH GFR
ALT: 20 U/L (ref 0–35)
AST: 18 U/L (ref 0–37)
Albumin: 4.5 g/dL (ref 3.5–5.2)
Alkaline Phosphatase: 58 U/L (ref 39–117)
BUN: 11 mg/dL (ref 6–23)
CO2: 30 meq/L (ref 19–32)
Calcium: 9.8 mg/dL (ref 8.4–10.5)
Chloride: 98 meq/L (ref 96–112)
Creatinine, Ser: 0.75 mg/dL (ref 0.40–1.20)
GFR: 86.22 mL/min (ref >60.00)
Glucose, Bld: 94 mg/dL (ref 70–99)
Potassium: 4.5 meq/L (ref 3.5–5.1)
Sodium: 138 meq/L (ref 135–145)
Total Bilirubin: 0.5 mg/dL (ref 0.2–1.2)
Total Protein: 7.6 g/dL (ref 6.0–8.3)

## 2024-07-22 LAB — CBC WITH DIFFERENTIAL/PLATELET
Basophils Absolute: 0.1 K/uL (ref 0.0–0.1)
Basophils Relative: 0.9 % (ref 0.0–3.0)
Eosinophils Absolute: 0.1 K/uL (ref 0.0–0.7)
Eosinophils Relative: 2.1 % (ref 0.0–5.0)
HCT: 43.2 % (ref 36.0–46.0)
Hemoglobin: 14.5 g/dL (ref 12.0–15.0)
Lymphocytes Relative: 35.8 % (ref 12.0–46.0)
Lymphs Abs: 2.3 K/uL (ref 0.7–4.0)
MCHC: 33.6 g/dL (ref 30.0–36.0)
MCV: 92.7 fl (ref 78.0–100.0)
Monocytes Absolute: 0.5 K/uL (ref 0.1–1.0)
Monocytes Relative: 7.4 % (ref 3.0–12.0)
Neutro Abs: 3.4 K/uL (ref 1.4–7.7)
Neutrophils Relative %: 53.8 % (ref 43.0–77.0)
Platelets: 297 K/uL (ref 150.0–400.0)
RBC: 4.66 Mil/uL (ref 3.87–5.11)
RDW: 12.8 % (ref 11.5–15.5)
WBC: 6.3 K/uL (ref 4.0–10.5)

## 2024-07-22 LAB — HEMOGLOBIN A1C: Hgb A1c MFr Bld: 5.8 % (ref 4.6–6.5)

## 2024-07-22 LAB — TSH: TSH: 1.5 u[IU]/mL (ref 0.35–5.50)

## 2024-07-22 MED ORDER — ROSUVASTATIN CALCIUM 10 MG PO TABS: 10.0000 mg | ORAL_TABLET | Freq: Every day | ORAL | 3 refills | Status: AC

## 2024-07-22 NOTE — Patient Instructions (Signed)
-  It was nice to meet you this morning and look forward to taking care of you. -Influenza vaccine provided.  -Placed a referral to gastroenterology for colonoscopy. Please call the office or send a MyChart message if you do not receive a phone call or a MyChart message about appointment in 2 weeks.  -Ordered mammogram. -Placed a referral to ENT for change in voice and throat discomfort when singing. Please call the office or send a MyChart message if you do not receive a phone call or a MyChart message about appointment in 2 weeks.  -Continue all medications. -Ordered labs. Office will call with lab results and will be available via MyChart. -Follow up in 6 months for a physical and please be fasting.

## 2024-07-22 NOTE — Assessment & Plan Note (Signed)
 Blood pressure is controlled. Continue Lisinopril-HCTZ 10-12.5mg  daily. Ordered CMP.

## 2024-07-22 NOTE — Assessment & Plan Note (Signed)
 Been prescribed medication in the past, but didn't take it. Ordered lipid panel and CMP. Patient is fasting.

## 2024-07-22 NOTE — Progress Notes (Signed)
 New Patient Office Visit  Subjective   Patient ID: Margaret Oliver, female    DOB: March 31, 1966  Age: 59 y.o. MRN: [id number]  CC:  Chief Complaint  Patient presents with   Establish Care    HPI Margaret Oliver presents to establish care with new provider.  Patients previous primary care provider: [facility] in [place] with Lucie Mace, [place]. Last seen a little less than a year ago.   Specialist: None   HCTZ: Chronic. Patient is taking Lisinopril-HCTZ 10-12.5mg  daily. Denies monitoring her blood pressure at home. Denies any CP, SHOB, HA, dizziness, lightheadedness, or lower extremity edema.  BP Readings from Last 3 Encounters:  07/22/24 134/82  06/16/21 111/75  03/24/13 128/86     Hyperlipidemia: Chronic. Patient was prescribed Simvastatin 40mg  daily, but has decided to not take medication.   Patient reports around when covid started, she noticed gradually that her singing voice changed and fells a pulling in her throat. Also, reports she has noticed her regular voice changing to lower pitch. Denies any other pain or discomfort in there throat or neck area besides when she is singing. Denies any complications with swallowing.   Outpatient Encounter Medications as of 07/22/2024  Medication Sig   lisinopril-hydrochlorothiazide (PRINZIDE,ZESTORETIC) 10-12.5 MG per tablet Take 1 tablet by mouth at bedtime.   [DISCONTINUED] predniSONE  (DELTASONE ) 20 MG tablet Take 2 tablets (40 mg total) by mouth daily.   [DISCONTINUED] promethazine -dextromethorphan (PROMETHAZINE -DM) 6.25-15 MG/5ML syrup Take 5 mLs by mouth 4 (four) times daily as needed for cough.   [DISCONTINUED] simvastatin (ZOCOR) 40 MG tablet Take 40 mg by mouth every evening. (Patient not taking: Reported on 07/22/2024)   No facility-administered encounter medications on file as of 07/22/2024.    Past Medical History:  Diagnosis Date   Fibroids    Hypertension     Past Surgical History:  Procedure Laterality Date    CESAREAN SECTION  1992   CHOLECYSTECTOMY  2002   TUBAL LIGATION  2000   UTERINE ARTERY EMBOLIZATION  02/26/2013    Family History  Problem Relation Age of Onset   Kidney disease Mother    Diabetes Mother    Hypertension Mother    Kidney disease Father    Stroke Father     Social History   Socioeconomic History   Marital status: Married    Spouse name: Not on file   Number of children: 3   Years of education: Not on file   Highest education level: Some college, no degree  Occupational History   Not on file  Tobacco Use   Smoking status: Never   Smokeless tobacco: Never  Vaping Use   Vaping status: Never Used  Substance and Sexual Activity   Alcohol use: Never   Drug use: Never   Sexual activity: Yes    Birth control/protection: Surgical  Other Topics Concern   Not on file  Social History Narrative   Not on file   Social Drivers of Health   Tobacco Use: Low Risk (07/22/2024)   Patient History    Smoking Tobacco Use: Never    Smokeless Tobacco Use: Never    Passive Exposure: Not on file  Financial Resource Strain: Low Risk (07/22/2024)   Overall Financial Resource Strain (CARDIA)    Difficulty of Paying Living Expenses: Not very hard  Food Insecurity: No Food Insecurity (07/22/2024)   Epic    Worried About Radiation Protection Practitioner of Food in the Last Year: Never true    [employer] of Food in  the Last Year: Never true  Transportation Needs: No Transportation Needs (07/22/2024)   Epic    Lack of Transportation (Medical): No    Lack of Transportation (Non-Medical): No  Physical Activity: Inactive (07/22/2024)   Exercise Vital Sign    Days of Exercise per Week: 0 days    Minutes of Exercise per Session: Not on file  Stress: No Stress Concern Present (07/22/2024)   [employer] of Occupational Health - Occupational Stress Questionnaire    Feeling of Stress: Not at all  Social Connections: Socially Integrated (07/22/2024)   Social Connection and Isolation Panel     Frequency of Communication with Friends and Family: More than three times a week    Frequency of Social Gatherings with Friends and Family: Once a week    Attends Religious Services: More than 4 times per year    Active Member of [employer] or Organizations: Yes    Attends Banker Meetings: More than 4 times per year    Marital Status: Married  Catering Manager Violence: Not At Risk (07/22/2024)   Epic    Fear of Current or Ex-Partner: No    Emotionally Abused: No    Physically Abused: No    Sexually Abused: No  Depression (PHQ2-9): Low Risk (07/22/2024)   Depression (PHQ2-9)    PHQ-2 Score: 0  Alcohol Screen: Low Risk (07/22/2024)   Alcohol Screen    Last Alcohol Screening Score (AUDIT): 0  Housing: Low Risk (07/22/2024)   Epic    Unable to Pay for Housing in the Last Year: No    Number of Times Moved in the Last Year: 0    Homeless in the Last Year: No  Utilities: Not At Risk (07/22/2024)   Epic    Threatened with loss of utilities: No  Health Literacy: Adequate Health Literacy (07/22/2024)   B1300 Health Literacy    Frequency of need for help with medical instructions: Never    ROS See HPI above    Objective  BP 134/82   Pulse 93   Temp 98.9 F (37.2 C) (Oral)   Ht 5' 4 (1.626 m)   Wt 181 lb (82.1 kg)   SpO2 95%   BMI 31.07 kg/m   Physical Exam Vitals reviewed.  Constitutional:      General: She is not in acute distress.    Appearance: Normal appearance. She is obese. She is not ill-appearing, toxic-appearing or diaphoretic.  HENT:     Head: Normocephalic and atraumatic.     Mouth/Throat:     Pharynx: Oropharynx is clear. Uvula midline. No pharyngeal swelling, oropharyngeal exudate, posterior oropharyngeal erythema or uvula swelling.  Eyes:     General:        Right eye: No discharge.        Left eye: No discharge.     Conjunctiva/sclera: Conjunctivae normal.  Cardiovascular:     Rate and Rhythm: Normal rate and regular rhythm.     Heart  sounds: Normal heart sounds. No murmur heard.    No friction rub. No gallop.  Pulmonary:     Effort: Pulmonary effort is normal. No respiratory distress.     Breath sounds: Normal breath sounds.  Musculoskeletal:        General: Normal range of motion.  Lymphadenopathy:     Head:     Right side of head: No submental, submandibular or tonsillar adenopathy.     Left side of head: No submental, submandibular or tonsillar adenopathy.  Cervical: No cervical adenopathy.     Right cervical: No superficial cervical adenopathy.    Left cervical: No superficial cervical adenopathy.  Skin:    General: Skin is warm and dry.  Neurological:     General: No focal deficit present.     Mental Status: She is alert and oriented to person, place, and time. Mental status is at baseline.     Motor: No weakness.     Gait: Gait normal.  Psychiatric:        Mood and Affect: Mood normal.        Behavior: Behavior normal.        Thought Content: Thought content normal.        Judgment: Judgment normal.      Assessment & Plan:  Primary hypertension Assessment & Plan: Blood pressure is controlled. Continue Lisinopril-HCTZ 10-12.5mg  daily. Ordered CMP.   Orders: -     Comprehensive metabolic panel with GFR  Hyperlipidemia, unspecified hyperlipidemia type Assessment & Plan: Been prescribed medication in the past, but didn't take it. Ordered lipid panel and CMP. Patient is fasting.   Orders: -     Comprehensive metabolic panel with GFR -     Lipid panel  Immunization due -     Flu vaccine trivalent PF, 6mos and older(Flulaval,Afluria,Fluarix,Fluzone)  Encounter for screening mammogram for malignant neoplasm of breast -     3D Screening Mammogram, Left and Right; Future  Colon cancer screening -     Ambulatory referral to Gastroenterology  Class 1 obesity due to excess calories with serious comorbidity and body mass index (BMI) of 31.0 to 31.9 in adult -     CBC with Differential/Platelet -      Comprehensive metabolic panel with GFR -     Hemoglobin A1c -     Lipid panel -     TSH  Abnormal thyroid stimulating hormone (TSH) level -     TSH  Change in voice -     Ambulatory referral to ENT  Throat discomfort -     Ambulatory referral to ENT  Encounter to establish care  1.Review health maintenance: -Tdap: Declines  -Mammogram: Order  -HIV and Hep C screening: Declines  -Cervical cancer screening: Request records from previous PCP-2-3 years ago  -Colonoscopy: Referred to GI  -PNA vaccine: Declines  -Zoster vaccine: Declines  -Covid booster: Declines  -Influenza vaccine: Administered  2.Placed a referral to ENT for change in voice and throat discomfort when singing. Please call the office or send a MyChart message if you do not receive a phone call or a MyChart message about appointment in 2 weeks.  3. Ordered labs based on BMI and reports she either had an elevated or low TSH in the past, but can't remember which one.  Return in about 6 months (around 01/20/2025) for physical.   Seretha Estabrooks, NP

## 2024-08-19 ENCOUNTER — Ambulatory Visit
Admission: RE | Admit: 2024-08-19 | Discharge: 2024-08-19 | Disposition: A | Discharge status: Home / Self Care | Source: Ambulatory Visit | Attending: Family Medicine | Admitting: Family Medicine

## 2024-08-19 DIAGNOSIS — Z1231 Encounter for screening mammogram for malignant neoplasm of breast: Secondary | ICD-10-CM

## 2024-08-30 ENCOUNTER — Institutional Professional Consult (permissible substitution) (INDEPENDENT_AMBULATORY_CARE_PROVIDER_SITE_OTHER)

## 2024-09-01 ENCOUNTER — Ambulatory Visit (INDEPENDENT_AMBULATORY_CARE_PROVIDER_SITE_OTHER): Admitting: Otolaryngology

## 2024-09-01 ENCOUNTER — Encounter (INDEPENDENT_AMBULATORY_CARE_PROVIDER_SITE_OTHER): Payer: Self-pay | Admitting: *Deleted

## 2024-09-01 VITALS — BP 136/91 | HR 92 | Wt 181.0 lb

## 2024-09-01 DIAGNOSIS — K219 Gastro-esophageal reflux disease without esophagitis: Secondary | ICD-10-CM | POA: Diagnosis not present

## 2024-09-01 DIAGNOSIS — R490 Dysphonia: Secondary | ICD-10-CM

## 2024-09-01 MED ORDER — PANTOPRAZOLE SODIUM 40 MG PO TBEC: 40.0000 mg | DELAYED_RELEASE_TABLET | Freq: Two times a day (BID) | ORAL | 1 refills | Status: AC

## 2024-09-01 NOTE — Progress Notes (Signed)
 Reason for Consult: Sore throat Referring Physician: Dr. Billy Carter Margaret Oliver is an 59 y.o. female.  HPI: Here for evaluation of hoarseness that she has noticed that she has a little deeper voice and she has over the last year.  There is no sore throat.  No dysphagia or odynophagia.  She also has noticed that her singing voice she cannot hit is high and notes that she used to be able to.  She feels like there is a tightness in her throat limiting that capability.  She has mucus feeling in the throat.  Occasional cough.  No significant heartburn or indigestion.  No nasal symptoms or sinus activity.  Past Medical History:  Diagnosis Date   Fibroids    Hypertension     Past Surgical History:  Procedure Laterality Date   CESAREAN SECTION  1992   CHOLECYSTECTOMY  2002   TUBAL LIGATION  2000   UTERINE ARTERY EMBOLIZATION  02/26/2013    Family History  Problem Relation Age of Onset   Kidney disease Mother    Diabetes Mother    Hypertension Mother    Kidney disease Father    Stroke Father     Social History:  reports that she has never smoked. She has never used smokeless tobacco. She reports that she does not drink alcohol and does not use drugs.  Allergies: Allergies[1]   No results found for this or any previous visit (from the past 48 hours).  No results found.  ROS There were no vitals taken for this visit. Physical Exam Constitutional:      Appearance: Normal appearance.  HENT:     Head: Normocephalic and atraumatic.     Right Ear: Tympanic membrane is without lesions and middle ear aerated, ear canal and external ear normal.     Left Ear: Tympanic membrane is without lesions and middle ear aerated, ear canal and external ear normal.     Nose: Nose without deviation of septum.  Turbinates with mild hypertrophy, No significant swelling or masses.     Oral cavity/oropharynx: Mucous membranes are moist. No lesions or masses    Larynx: normal voice. Mirror attempted  without success    Eyes:     Extraocular Movements: Extraocular movements intact.     Conjunctiva/sclera: Conjunctivae normal.     Pupils: Pupils are equal, round, and reactive to light.  Cardiovascular:     Rate and Rhythm: Normal rate.  Pulmonary:     Effort: Pulmonary effort is normal.  Musculoskeletal:     Cervical back: Normal range of motion and neck supple. No rigidity.  Lymphadenopathy:     Cervical: No cervical adenopathy or masses.salivary glands without lesions. .     Salivary glands- no mass or swelling Neurological:     Mental Status: He is alert. CN 2-12 intact. 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  This usually is reflux.  We talked about a fiberoptic exam but treating her first with Protonix  and then seeing her back in a month would be appropriate approach.  She would rather do medicine first.  She will follow-up in 1 month sooner if anything is worse.  We talked about reflux precautions.  Margaret Oliver 09/01/2024, 10:41 AM        [1] No Known Allergies

## 2024-09-07 ENCOUNTER — Other Ambulatory Visit

## 2024-09-16 ENCOUNTER — Encounter: Payer: Self-pay | Admitting: Family Medicine

## 2024-09-16 ENCOUNTER — Other Ambulatory Visit: Payer: Self-pay

## 2024-09-16 DIAGNOSIS — I10 Essential (primary) hypertension: Secondary | ICD-10-CM

## 2024-09-16 MED ORDER — LISINOPRIL-HYDROCHLOROTHIAZIDE 20-12.5 MG PO TABS: 1.0000 | ORAL_TABLET | Freq: Every day | ORAL | 1 refills | Status: AC

## 2024-09-29 ENCOUNTER — Ambulatory Visit (INDEPENDENT_AMBULATORY_CARE_PROVIDER_SITE_OTHER): Admitting: Otolaryngology

## 2025-01-20 ENCOUNTER — Encounter: Admitting: Family Medicine
